# Patient Record
Sex: Male | Born: 1955 | Race: White | Hispanic: No | Marital: Single | Smoking: Never smoker
Health system: Southern US, Community
[De-identification: ages and names within clinical notes are randomized; demographics above are authoritative.]

## PROBLEM LIST (undated history)

## (undated) DIAGNOSIS — K219 Gastro-esophageal reflux disease without esophagitis: Secondary | ICD-10-CM

## (undated) DIAGNOSIS — I10 Essential (primary) hypertension: Secondary | ICD-10-CM

## (undated) HISTORY — DX: Gastro-esophageal reflux disease without esophagitis: K21.9

## (undated) HISTORY — DX: Essential (primary) hypertension: I10

## (undated) HISTORY — PX: OTHER SURGICAL HISTORY: SHX169

---

## 2002-05-23 ENCOUNTER — Encounter: Payer: Self-pay | Admitting: Internal Medicine

## 2010-10-02 ENCOUNTER — Encounter: Payer: Self-pay | Admitting: Internal Medicine

## 2010-10-17 ENCOUNTER — Encounter: Payer: Self-pay | Admitting: Internal Medicine

## 2010-10-17 ENCOUNTER — Ambulatory Visit: Payer: Self-pay | Admitting: Internal Medicine

## 2010-10-17 DIAGNOSIS — I10 Essential (primary) hypertension: Secondary | ICD-10-CM

## 2010-10-17 DIAGNOSIS — R739 Hyperglycemia, unspecified: Secondary | ICD-10-CM

## 2010-10-17 HISTORY — DX: Essential (primary) hypertension: I10

## 2010-10-24 ENCOUNTER — Encounter: Admission: RE | Admit: 2010-10-24 | Discharge: 2010-10-24 | Payer: Self-pay | Admitting: Internal Medicine

## 2010-10-29 ENCOUNTER — Encounter: Payer: Self-pay | Admitting: Internal Medicine

## 2010-10-31 ENCOUNTER — Ambulatory Visit: Payer: Self-pay | Admitting: Internal Medicine

## 2011-01-14 NOTE — Letter (Signed)
   [facility] [address] [place], [place]  [address] Phone: [phone]      October 29, 2010   GAELAN GLENNON [address] [place], [place] [address]  RE:  LAB RESULTS  Dear  Mr. GHEEN,  The following is an interpretation of your most recent lab tests.  Please take note of any instructions provided or changes to medications that have resulted from your lab work. Ultrasound reveals steatic hepatosis = fatty infiltration of the liver. This may be the cause of the elevated GGT.  Sorry I could not reach you by phone. Continue with you plan to reduce alcohol consumption to a limit of 2 4oz glasses of wine per day.  Please come see me if you have any questions about these lab results.   Sincerely Yours,    Jacques Navy MD

## 2011-01-14 NOTE — Assessment & Plan Note (Signed)
Summary: re-establish per pt/Bcbs/ # cd   Vital Signs:  Patient profile:   55 year old male Height:      68 inches Weight:      163 pounds BMI:     24.87 O2 Sat:      98 % on Room air Temp:     98.1 degrees F oral Pulse rate:   64 / minute BP sitting:   186 / 102  (left arm) Cuff size:   regular  Vitals Entered By: Bill Salinas CMA (October 17, 2010 3:06 PM)  O2 Flow:  Room air CC: Pt here to re-est care with primary/ ab   Primary Care Provider:  Jacques Navy MD  CC:  Pt here to re-est care with primary/ ab.  History of Present Illness: Mr. Panebianco presents to reestablish for on-going medical care. His visit is prompted by work related lab work which revealed an isolated elevation in GGT. He was alarmed by this, especially after doing some reading on the internet. As a result he has greatly curtailed his alcohol consumption having assumed that this lab reflected significant liver disease. Labs are reviewed: it is noted that the ALT, AST and alkaline phosphotase are normal. He has had no abdominal pain, no change in bowel habit, no nausea or vomiting and no jaundice. He does admit to drinking 3+ glasses of wine daily.  It is noted that his blood pressure is markedly elevated at today's visit. He has had no HA, no diploplia and no neurologic symptoms. He has no chest pain, decreased exercise tolerance or other cardiovascular symptoms.   Preventive Screening-Counseling & Management  Alcohol-Tobacco     Alcohol drinks/day: 3     Alcohol type: wine     Smoking Status: never  Caffeine-Diet-Exercise     Caffeine use/day: 3 cups daily     Does Patient Exercise: yes     Type of exercise: Walking     Exercise (avg: min/session): 30-60     Times/week: 5  Hep-HIV-STD-Contraception     Dental Visit-last 6 months no  Safety-Violence-Falls     Seat Belt Use: yes     Helmet Use: yes     Firearms in the Home: firearms in the home     Smoke Detectors: yes     Violence in the  Home: no risk noted     Sexual Abuse: no     Fall Risk: Low fall risk  Current Medications (verified): 1)  Pepcid Ac 10 Mg Tabs (Famotidine) .[name].. 1 Daily 2)  Claritin 10 Mg Tabs (Loratadine) .[name].. 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  The patient denies anorexia, fever, vision loss, decreased hearing, chest pain, syncope, dyspnea  on exertion, peripheral edema, prolonged cough, headaches, abdominal pain, hematuria, incontinence, muscle weakness, difficulty walking, depression, abnormal bleeding, enlarged lymph nodes, and angioedema.    Physical Exam  General:  WNWD white male in no distress Head:  Normocephalic and atraumatic without obvious abnormalities. No apparent alopecia or balding. Eyes:  vision grossly intact, pupils equal, pupils round, corneas and lenses clear, no optic disk abnormalities, and no retinal abnormalitiies.   Ears:  R ear normal and L ear normal.   Mouth:  Oral mucosa and oropharynx without lesions or exudates.  Teeth in good repair. Neck:  supple, full ROM, no thyromegaly, and no carotid bruits.   Chest Wall:  no deformities.   Lungs:  Normal respiratory effort, chest expands  symmetrically. Lungs are clear to auscultation, no crackles or wheezes. Heart:  Normal rate and regular rhythm. S1 and S2 normal without gallop, murmur, click, rub or other extra sounds. Abdomen:  soft, non-tender, normal bowel sounds, no masses, no guarding, no hepatomegaly, and no splenomegaly.   Msk:  normal ROM and no joint deformities.   Pulses:  2+ radial pulse Extremities:  No clubbing, cyanosis, edema, or deformity noted with normal full range of motion of all joints.   Neurologic:  alert & oriented X3, cranial nerves II-XII intact, strength normal in all extremities, gait normal, and DTRs symmetrical and normal.   Skin:  turgor normal and color normal.  No jaundice. Cervical Nodes:  no anterior cervical adenopathy and no posterior cervical adenopathy.   Psych:  Oriented X3, memory intact for recent and remote, normally interactive, good eye contact, and not anxious appearing.     Impression & Recommendations:  Problem # 1:  OTHER ABNORMAL BLOOD CHEMISTRY (ICD-790.6) outside labs: AST 47, ALT 46, Alk Phos 63, Hep B &  Hep C negative, albumin normal. Exam is normal. There is little evidence of liver disease by exam and labs. Isolated GGT elevation is of questionable significance with pathologic elevations always being associated with elevated alkaline phosphotase and transaminases.  Plan - Abdominal U/S to evaluated liver architecture and and gallbladder.           prudence in alcohol consumption = 2 oz per day (2 x 4 oz glass of alcohol)  Orders: Radiology Referral (Radiology)  Problem # 2:  ELEVATED BLOOD PRESSURE WITHOUT DIAGNOSIS OF HYPERTENSION (ICD-796.2) Patient with significant elevation in BP. He has no co-morbidities. 12 lead EKG is normal.  Plan - initiate diuretic therapy with HCTZ 25 mg once daily           f/u BP in 3 weeks.  His updated medication list for this problem includes:    Hydrochlorothiazide 25 Mg Tabs (Hydrochlorothiazide) .[name]... 1 by mouth once  daily  Problem # 3:  Preventive Health Care (ICD-V70.0) Patient with no compliants and feels generally well. His screening labs were normal except for GGT: LDL 81, HDL 97.7 (extremely positive and protective value), srum glucose of 69. He is a good candidate for routine colorectal cancer screening with colonoscopy which can be scheduled at his convenience. Will need to check on immunizations, i.e. tetnus.  Complete Medication List: 1)  Pepcid Ac 10 Mg Tabs (Famotidine) .[name].. 1 daily 2)  Claritin 10 Mg Tabs (Loratadine) .[name].. 1 tablet daily 3)  Hydrochlorothiazide 25 Mg Tabs (Hydrochlorothiazide) .[name].. 1 by mouth once daily  Other Orders: EKG w/ Interpretation ([address]) Prescriptions: HYDROCHLOROTHIAZIDE 25 MG TABS (HYDROCHLOROTHIAZIDE) 1 by mouth once daily  #30 x 12   Entered and Authorized by:  Jacques Navy MD   Signed by:   Jacques Navy MD on 10/17/2010   Method used:   Electronically to        [facility]* (retail)       [address]       [place], [place]  [id number]       Ph: [phone]       Fax: [phone]   RxID:   [id number]    Orders Added: 1)  EKG w/ Interpretation [[address]] 2)  New Patient Level III [[address]] 3)  Radiology Referral [Radiology]

## 2011-01-14 NOTE — Assessment & Plan Note (Signed)
Summary: 2 wk f/u#/cd   Vital Signs:  Patient profile:   55 year old male Height:      68 inches Weight:      162 pounds BMI:     24.72 O2 Sat:      97 % on Room air Temp:     98.0 degrees F oral Pulse rate:   68 / minute BP sitting:   138 / 84  (left arm) Cuff size:   regular  Vitals Entered By: Bill Salinas CMA (October 31, 2010 2:12 PM)  O2 Flow:  Room air CC: pt here for follow up on BP after starting HCTZ/ ab   Primary Care Provider:  Jacques Navy MD  CC:  pt here for follow up on BP after starting HCTZ/ ab.  History of Present Illness: Patient returns for follow-up of blood pressure. He was started on HCTZ which he is tolerating well. BP is better controlled.  Did review U/S done as part of work up of isolated elevation in GGT. He was revealed to have fatty infiltrate of the liver otherwise a normal study. He reports that he has continued to follow a reduced alcohol intake regimen.   Current Medications (verified): 1)  Pepcid Ac 10 Mg Tabs (Famotidine) .[name].. 1 Daily 2)  Claritin 10 Mg Tabs (Loratadine) .[name].. 1 Tablet Daily 3)  Hydrochlorothiazide 25 Mg Tabs (Hydrochlorothiazide) .[name].. 1 By Mouth Once Daily  Allergies (verified): No Known Drug Allergies PMH-FH-SH reviewed-no changes except otherwise noted  Review of Systems  The patient denies anorexia, weight loss, decreased hearing, chest pain, dyspnea on exertion, prolonged cough, abdominal pain, muscle weakness, depression, and angioedema.    Physical Exam  General:  Well-developed,well-nourished,in no acute distress; alert,appropriate and cooperative throughout examination Eyes:  C&S clear Lungs:  normal respiratory effort and normal breath sounds.   Heart:  normal rate and regular rhythm.   Neurologic:  alert & oriented X3, cranial nerves II-XII intact, and gait normal.   Skin:  turgor normal and color normal.   Psych:  Oriented X3, normally interactive, and good eye contact.     Impression &  Recommendations:  Problem # 1:  OTHER ABNORMAL BLOOD CHEMISTRY (ICD-790.6) Isolated GGT elevation - maybe related to alcohol.  Plan - limit alcohol intake to an average of 2 oz per day.          low fat diet.   Problem # 2:  ELEVATED BLOOD PRESSURE WITHOUT DIAGNOSIS OF HYPERTENSION (ICD-796.2)  His updated medication list for this problem includes:    Hydrochlorothiazide 25 Mg Tabs (Hydrochlorothiazide) .[name]... 1 by mouth once daily  BP today: 138/84 Prior BP: 186/102 (10/17/2010)  Instructed in low sodium diet (DASH Handout) and behavior modification.   Doing well with HCTZ and no side affects.    Complete Medication List: 1)  Pepcid Ac 10 Mg Tabs (Famotidine) .[name].. 1 daily 2)  Claritin 10 Mg Tabs (Loratadine) .[name].. 1 tablet daily 3)  Hydrochlorothiazide 25 Mg Tabs (Hydrochlorothiazide) .[name].. 1 by mouth once daily   Orders Added: 1)  Est. Patient Level III [[address]]

## 2011-01-16 NOTE — Progress Notes (Signed)
Summary: [facility]   Imported By: Sherian Rein 11/28/2010 09:46:28  _____________________________________________________________________  External Attachment:    Type:   Image     Comment:   External Document

## 2011-01-23 ENCOUNTER — Other Ambulatory Visit: Payer: Self-pay

## 2011-01-27 ENCOUNTER — Encounter (INDEPENDENT_AMBULATORY_CARE_PROVIDER_SITE_OTHER): Payer: Self-pay | Admitting: *Deleted

## 2011-01-27 ENCOUNTER — Other Ambulatory Visit: Payer: Self-pay

## 2011-01-27 ENCOUNTER — Other Ambulatory Visit: Payer: Self-pay | Admitting: Internal Medicine

## 2011-01-27 DIAGNOSIS — R4182 Altered mental status, unspecified: Secondary | ICD-10-CM

## 2011-01-27 DIAGNOSIS — Z0389 Encounter for observation for other suspected diseases and conditions ruled out: Secondary | ICD-10-CM

## 2011-01-27 DIAGNOSIS — Z0000 Encounter for general adult medical examination without abnormal findings: Secondary | ICD-10-CM

## 2011-01-27 LAB — TSH: TSH: 2.46 u[IU]/mL (ref 0.35–5.50)

## 2011-01-27 LAB — CBC WITH DIFFERENTIAL/PLATELET
Basophils Absolute: 0 10*3/uL (ref 0.0–0.1)
Basophils Relative: 0.6 % (ref 0.0–3.0)
HCT: 46.5 % (ref 39.0–52.0)
Hemoglobin: 15.9 g/dL (ref 13.0–17.0)
Lymphs Abs: 1.6 10*3/uL (ref 0.7–4.0)
MCHC: 34.2 g/dL (ref 30.0–36.0)
Monocytes Relative: 9.1 % (ref 3.0–12.0)
Neutro Abs: 3.4 10*3/uL (ref 1.4–7.7)
RBC: 4.85 Mil/uL (ref 4.22–5.81)
RDW: 13.1 % (ref 11.5–14.6)

## 2011-01-27 LAB — LIPID PANEL
HDL: 73.5 mg/dL (ref >39.00)
Total CHOL/HDL Ratio: 2

## 2011-01-27 LAB — URINALYSIS
Bilirubin Urine: NEGATIVE
Leukocytes, UA: NEGATIVE
Nitrite: NEGATIVE
Specific Gravity, Urine: 1.01 (ref 1.000–1.030)
Total Protein, Urine: NEGATIVE
pH: 7 (ref 5.0–8.0)

## 2011-01-27 LAB — BASIC METABOLIC PANEL
CO2: 31 mEq/L (ref 19–32)
GFR: 95.81 mL/min (ref >60.00)
Glucose, Bld: 118 mg/dL — ABNORMAL HIGH (ref 70–99)
Potassium: 3.4 mEq/L — ABNORMAL LOW (ref 3.5–5.1)
Sodium: 134 mEq/L — ABNORMAL LOW (ref 135–145)

## 2011-01-27 LAB — PSA: PSA: 1.3 ng/mL (ref 0.10–4.00)

## 2011-01-27 LAB — GAMMA GT: GGT: 106 U/L — ABNORMAL HIGH (ref 7–51)

## 2011-01-27 LAB — HEPATIC FUNCTION PANEL
AST: 32 U/L (ref 0–37)
Albumin: 4 g/dL (ref 3.5–5.2)
Total Protein: 6.8 g/dL (ref 6.0–8.3)

## 2011-01-30 ENCOUNTER — Encounter (INDEPENDENT_AMBULATORY_CARE_PROVIDER_SITE_OTHER): Payer: BC Managed Care – PPO | Admitting: Internal Medicine

## 2011-01-30 ENCOUNTER — Encounter: Payer: Self-pay | Admitting: Internal Medicine

## 2011-01-30 DIAGNOSIS — Z0000 Encounter for general adult medical examination without abnormal findings: Secondary | ICD-10-CM

## 2011-02-03 ENCOUNTER — Telehealth: Payer: Self-pay | Admitting: Internal Medicine

## 2011-02-05 NOTE — Assessment & Plan Note (Signed)
Summary: CPE & Hypertension Maintainenance   Vital Signs:  Patient profile:   55 year old male Height:      68.50 inches Weight:      156.50 pounds BMI:     23.53 O2 Sat:      98 % on Room air Temp:     98.3 degrees F oral Pulse rate:   73 / minute Resp:     18 per minute BP sitting:   146 / 94  (left arm)  Vitals Entered By: Burnard Leigh CMA(AAMA) (January 30, 2011 2:44 PM)  O2 Flow:  Room air CC: Wellness CPE & Blood Pressure Maintenance Is Patient Diabetic? No   Primary Care Provider:  Jacques Navy MD  CC:  Wellness CPE & Blood Pressure Maintenance.  History of Present Illness: Tyrone Davis presents for an annual wellness exam. He is feeling well and has no specific medical complaints. He has curtailed his alcohol intake due to elevated GGT at 208. He is staying active but not really exercsing on a regular basis.   Preventive Screening-Counseling & Management  Alcohol-Tobacco     Alcohol drinks/day: 2     Alcohol type: wine     Smoking Status: never  Caffeine-Diet-Exercise     Caffeine use/day: 2 cups per day     Diet Comments: No Regular     Does Patient Exercise: yes     Type of exercise: walking 4 miles     Exercise (avg: min/session): >60     Times/week: 7  Hep-HIV-STD-Contraception     Dental Visit-last 6 months yes  Safety-Violence-Falls     Seat Belt Use: yes     Firearms in the Home: firearms in the home     Violence in the Home: not applicable     Fall Risk: No  Current Medications (verified): 1)  Pepcid Ac 10 Mg Tabs (Famotidine) .[name].. 1 Daily 2)  Claritin 10 Mg Tabs (Loratadine) .[name].. 1 Tablet Daily 3)  Hydrochlorothiazide 25 Mg Tabs (Hydrochlorothiazide) .[name].. 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    Physical Exam  General:  alert, well-developed, well-nourished, well-hydrated, and normal appearance.   Head:  Normocephalic and atraumatic without obvious abnormalities. No apparent alopecia or balding. Eyes:  vision grossly intact, pupils equal, pupils round, pupils reactive to light, corneas and lenses clear, no injection, and no optic disk abnormalities.   Ears:  External ear exam shows no significant lesions or deformities.  Otoscopic examination reveals clear canals, tympanic membranes are intact  bilaterally without bulging, retraction, inflammation or discharge. Hearing is grossly normal bilaterally. Nose:  no external deformity, no external erythema, and no nasal discharge.   Mouth:  Oral mucosa and oropharynx without lesions or exudates.  Teeth in good repair. Neck:  supple, full ROM, no masses, no thyromegaly, and no carotid bruits.   Chest Wall:  No deformities, masses, tenderness or gynecomastia noted. Lungs:  normal respiratory effort, normal breath sounds, no crackles, and no wheezes.   Heart:  normal rate,  regular rhythm, no murmur, no gallop, and no JVD.   Abdomen:  soft, non-tender, normal bowel sounds, no guarding, no rigidity, and no hepatomegaly.   Prostate:  deferred to normal PSA Msk:  normal ROM, no joint tenderness, no joint swelling, no joint warmth, and no joint instability.   Pulses:  2+ radial pulse Extremities:  No clubbing, cyanosis, edema, or deformity noted with normal full range of motion of all joints.   Neurologic:  alert & oriented X3, cranial nerves II-XII intact, strength normal in all extremities, sensation intact to light touch, gait normal, and DTRs symmetrical and normal.   Skin:  turgor normal, color normal, no suspicious lesions, no petechiae, and no ulcerations.   Cervical Nodes:  no anterior cervical adenopathy and no posterior cervical adenopathy.   Psych:  Oriented X3, memory intact for recent and remote, normally interactive, and good eye contact.     Impression & Recommendations:  Problem # 1:  OTHER ABNORMAL BLOOD CHEMISTRY (ICD-790.6) Repeat GGT is down to 100 range.   Plan - continue to be careful about alcohol consumption.          Can get a repeat lab in  3  months  Problem # 2:  ELEVATED BLOOD PRESSURE WITHOUT DIAGNOSIS OF HYPERTENSION (ICD-796.2)  His updated medication list for this problem includes:    Hydrochlorothiazide 25 Mg Tabs (Hydrochlorothiazide) .[name]... 1 by mouth once daily  BP today: 146/94 Prior BP: 138/84 (10/31/2010)  Instructed in low sodium diet (DASH Handout) and behavior modification.    Suboptimal control  Plan - continue HCTZ           monitor BP at home. IF SBP consistently 140 or higher will need to adjust medication  Problem # 3:  Preventive Health Care (ICD-V70.0) Interval history is unremarkable. Physical exam is normal. Lab resuslts reveal an excellent lipid panel. Potassium is just a little low and will stgart potassium replacement. Glucose was elevated at 119, previous lab glucose was 69. He will need to be  prudent about sugar and simple carbohydrates. Will check an A1C in 3 months. No colonoscopy on record and he is a candidate for routine colorectal cancer screening which we can schedule once he agrees to have the study. Immunizations - had flu shot. Had a normal EKG in NOvember '11. Has a normal PSA.  In summary - a nice man who has mildly elevated serum glucose and mildly persistent elevation in GGT. Plans as above: return for lab in 3 months.   Complete Medication List: 1)  Pepcid Ac 10 Mg Tabs (Famotidine) .[name].. 1 daily 2)  Claritin 10 Mg Tabs (Loratadine) .[name].. 1 tablet daily 3)  Hydrochlorothiazide 25 Mg Tabs (Hydrochlorothiazide) .[name].. 1 by mouth once daily  Patient: Tyrone Davis Note: All result statuses are Final unless otherwise noted.  Tests: (1) BMP (METABOL)   Sodium               [L]  134 mEq/L  135-145   Potassium            [L]  3.4 mEq/L                   3.5-5.1   Chloride             [L]  94 mEq/L                    96-112   Carbon Dioxide            31 mEq/L                    19-32   Glucose              [H]  118 mg/dL                   16-10   BUN                       19 mg/dL                    9-60   Creatinine                0.9 mg/dL                   4.5-4.0   Calcium                   8.9 mg/dL                   9.8-11.9   GFR                       95.81 mL/min                >60.00  Tests: (2) CBC Platelet w/Diff (CBCD)   White Cell Count          5.7 K/uL                    4.5-10.5   Red Cell Count            4.85 Mil/uL                 4.22-5.81   Hemoglobin                15.9 g/dL                   14.7-82.9   Hematocrit                46.5 %                      39.0-52.0   MCV                       95.9 fl                     78.0-100.0   MCHC                      34.2 g/dL                   56.2-13.0   RDW  13.1 %                      11.5-14.6   Platelet Count            214.0 K/uL                   150.0-400.0   Neutrophil %              58.9 %                      43.0-77.0   Lymphocyte %              28.0 %                      12.0-46.0   Monocyte %                9.1 %                       3.0-12.0   Eosinophils%              3.4 %                       0.0-5.0   Basophils %               0.6 %                       0.0-3.0   Neutrophill Absolute      3.4 K/uL                    1.4-7.7   Lymphocyte Absolute       1.6 K/uL                    0.7-4.0   Monocyte Absolute         0.5 K/uL                    0.1-1.0  Eosinophils, Absolute                             0.2 K/uL                    0.0-0.7   Basophils Absolute        0.0 K/uL                    0.0-0.1  Tests: (3) Hepatic/Liver Function Panel (HEPATIC)   Total Bilirubin      [H]  1.4 mg/dL                   0.4-5.4   Direct Bilirubin          0.2 mg/dL                   0.9-8.1   Alkaline Phosphatase      51 U/L                      39-117   AST                       32 U/L  0-37   ALT                       30 U/L                      0-53   Total Protein             6.8 g/dL                    1.6-1.0   Albumin                   4.0 g/dL                    9.6-0.4  Tests: (4) TSH (TSH)   FastTSH                   2.46 uIU/mL                 0.35-5.50  Tests: (5) Lipid Panel (LIPID)   Cholesterol               158 mg/dL                   5-409     ATP III Classification            Desirable:  < 200 mg/dL                    Borderline High:  200 - 239 mg/dL               High:  > = 240 mg/dL   Triglycerides             35.0 mg/dL                  8.1-191.4     Normal:  <150 mg/dL     Borderline High:  782 - 199 mg/dL   HDL                       95.62 mg/dL                 >13.08   VLDL Cholesterol          7.0 mg/dL                   6.5-78.4   LDL Cholesterol           78 mg/dL                    6-96  CHO/HDL Ratio:  CHD Risk                             2                    Men           Women     1/2 Average Risk     3.4          3.3     Average Risk          5.0          4.4     2X Average Risk          9.6          7.1  3X Average Risk          15.0          11.0                           Tests: (6) UDip Only (UDIP)   Color                     LT. YELLOW       RANGE:  Yellow;Lt. Yellow   Clarity                   CLEAR                       Clear   Specific Gravity          1.010                       1.000 - 1.030   Urine Ph                  7.0                         5.0-8.0   Protein                   NEGATIVE                    Negative   Urine Glucose             NEGATIVE                    Negative   Ketones                   NEGATIVE                    Negative   Urine Bilirubin           NEGATIVE                    Negative   Blood                     NEGATIVE                    Negative   Urobilinogen              0.2                         0.0 - 1.0   Leukocyte Esterace        NEGATIVE                    Negative   Nitrite                   NEGATIVE                    Negative  Tests: (7) Prostate Specific Antigen (PSA)   PSA-Hyb                   1.30 ng/mL                  0.10-4.00  Tests: (8) GGT (GGT)   GGT                  [  H]  106 U/L                     7-51  Orders Added: 1)  Est. Patient 40-64 years [[address]]   Immunization History:  Influenza Immunization History:    Influenza:  historical (09/14/2010)   Immunization History:  Influenza Immunization History:    Influenza:  Historical (09/14/2010)      Laboratory Results

## 2011-02-11 NOTE — Progress Notes (Signed)
Summary: New Rx name and directions request/sls,cma  Phone Note From Pharmacy   Caller: [facility] Summary of Call: Pharmacy states they arte expecting a new Rx for Potassium Chlor.They need the new medication name and directions,,Callback to pharmacy is [phone]..sls,cma Initial call taken by: Burnard Leigh [facility]),  February 03, 2011 8:18 AM  Follow-up for Phone Call        K. K+ 10 me1, #30, 1 by mouth once daily as needed rfills Follow-up by: Jacques Navy MD,  February 04, 2011 1:52 PM    New/Updated Medications: KLOR-CON 10 10 MEQ CR-TABS (POTASSIUM CHLORIDE) 1 by mouth once daily Prescriptions: KLOR-CON 10 10 MEQ CR-TABS (POTASSIUM CHLORIDE) 1 by mouth once daily  #90 x 3   Entered by:   Vertis Kelch)   Authorized by:   Jacques Navy MD   Signed by:   Burnard Leigh [facility]) on 02/04/2011   Method used:   Electronically to        [facility]* (retail)       [address]       [place], [place]  [id number]       Ph: [phone]       Fax: [phone]   RxID:   [id number]

## 2011-04-29 ENCOUNTER — Other Ambulatory Visit: Payer: Self-pay | Admitting: Internal Medicine

## 2011-04-29 ENCOUNTER — Other Ambulatory Visit (INDEPENDENT_AMBULATORY_CARE_PROVIDER_SITE_OTHER): Payer: BC Managed Care – PPO

## 2011-04-29 DIAGNOSIS — I10 Essential (primary) hypertension: Secondary | ICD-10-CM

## 2011-04-29 DIAGNOSIS — R7989 Other specified abnormal findings of blood chemistry: Secondary | ICD-10-CM

## 2011-04-29 LAB — BASIC METABOLIC PANEL
Calcium: 9.2 mg/dL (ref 8.4–10.5)
Creatinine, Ser: 1 mg/dL (ref 0.4–1.5)
GFR: 80.73 mL/min (ref >60.00)
Sodium: 141 mEq/L (ref 135–145)

## 2011-04-29 LAB — GAMMA GT: GGT: 117 U/L — ABNORMAL HIGH (ref 7–51)

## 2011-04-29 LAB — HEMOGLOBIN A1C: Hgb A1c MFr Bld: 6.1 % (ref 4.6–6.5)

## 2011-05-05 ENCOUNTER — Encounter: Payer: Self-pay | Admitting: Internal Medicine

## 2011-10-24 ENCOUNTER — Other Ambulatory Visit: Payer: Self-pay | Admitting: *Deleted

## 2011-10-30 ENCOUNTER — Other Ambulatory Visit: Payer: Self-pay | Admitting: *Deleted

## 2011-10-30 MED ORDER — HYDROCHLOROTHIAZIDE 25 MG PO TABS: 25.0000 mg | ORAL_TABLET | Freq: Every day | ORAL | Status: DC

## 2011-12-18 ENCOUNTER — Encounter: Payer: Self-pay | Admitting: Internal Medicine

## 2012-01-01 ENCOUNTER — Ambulatory Visit (INDEPENDENT_AMBULATORY_CARE_PROVIDER_SITE_OTHER): Payer: BC Managed Care – PPO | Admitting: Internal Medicine

## 2012-01-01 VITALS — BP 160/96 | HR 71 | Temp 98.4°F | Wt 163.0 lb

## 2012-01-01 DIAGNOSIS — R030 Elevated blood-pressure reading, without diagnosis of hypertension: Secondary | ICD-10-CM

## 2012-01-01 MED ORDER — LISINOPRIL-HYDROCHLOROTHIAZIDE 10-12.5 MG PO TABS: 1.0000 | ORAL_TABLET | Freq: Every day | ORAL | Status: DC

## 2012-01-01 NOTE — Patient Instructions (Signed)
BP Readings from Last 3 Encounters:  01/01/12 160/96  01/30/11 146/94  10/31/10 138/84   No adequately controlled. Plan - will change to an ACE-I plus HCTZ =lisinopril/Hct 10/12.5 taken once a day. Generally a very well tolerated product. Plan - start new drug           Return in 3-4 weeks for lab work to check on kidney function            Blood pressure should consistently be under 140/90-keep record and report back.            Call for problems

## 2012-01-04 NOTE — Assessment & Plan Note (Signed)
BP Readings from Last 3 Encounters:  01/01/12 160/96  01/30/11 146/94  10/31/10 138/84   Suboptimal control  Plan - add lisinopril 10 mg, continuing HCTZ - 10/12.5           F/u lab in 1 month           Monitor BP and report back (OV note needed).

## 2012-01-04 NOTE — Progress Notes (Signed)
  Subjective:    Patient ID: Tyrone Davis, male    DOB: Aug 14, 1956, 56 y.o.   MRN: [id number]  HPI Mr. Walkins presents for follow-up of HTN. He was recently started on HCTZ as single agent. His BP readings have been running too high.   I have reviewed the patient's medical history in detail and updated the computerized patient record.    Review of Systems System review is negative for any constitutional, cardiac, pulmonary, GI or neuro symptoms or complaints other than as described in the HPI.     Objective:   Physical Exam Filed Vitals:   01/01/12 1458  BP: 160/96  Pulse: 71  Temp: 98.4 F (36.9 C)   Gen'l - WNWD heavyset white man in no distress C&S clear Pulom - normal respirations Cor - RRR Neuro A&O x 3        Assessment & Plan:

## 2012-01-08 IMAGING — US US ABDOMEN COMPLETE
1 series · 14 of 25 positions shown · non-contrast
Comparison: None.

CLINICAL DATA: Abnormal liver function tests

COMPLETE ABDOMINAL ULTRASOUND

[Series 1: us abdomen complete · 0.39mm/px · 14 of 70 slices shown]
[im 1/70]
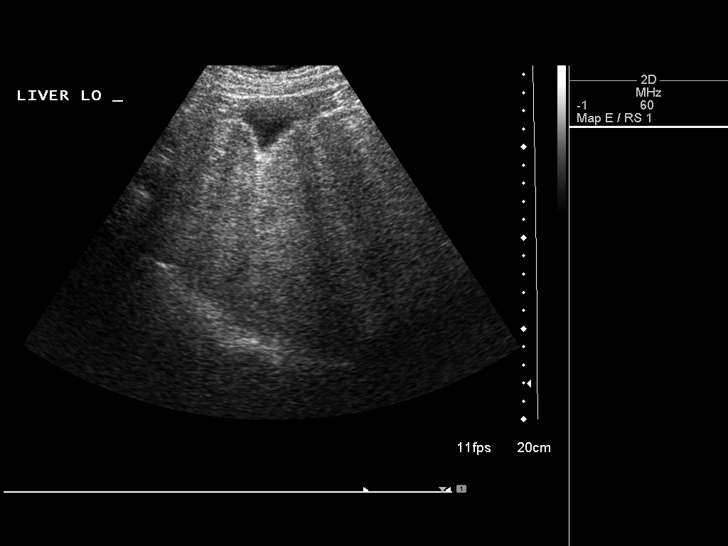
[im 6/70]
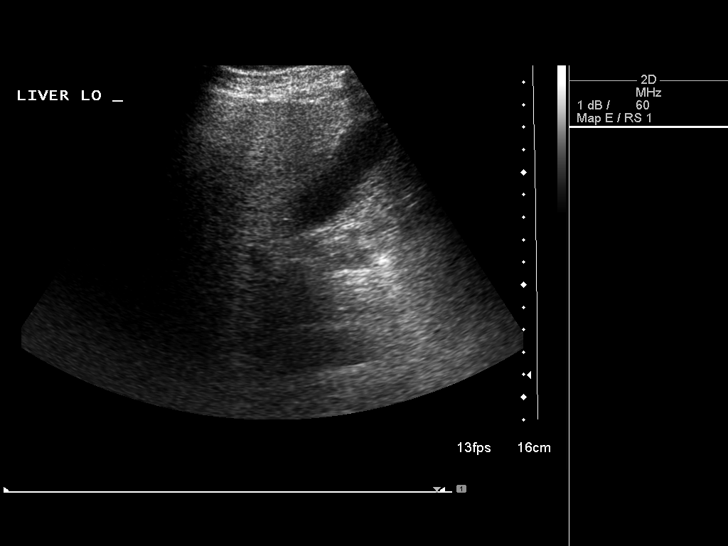
[im 12/70]
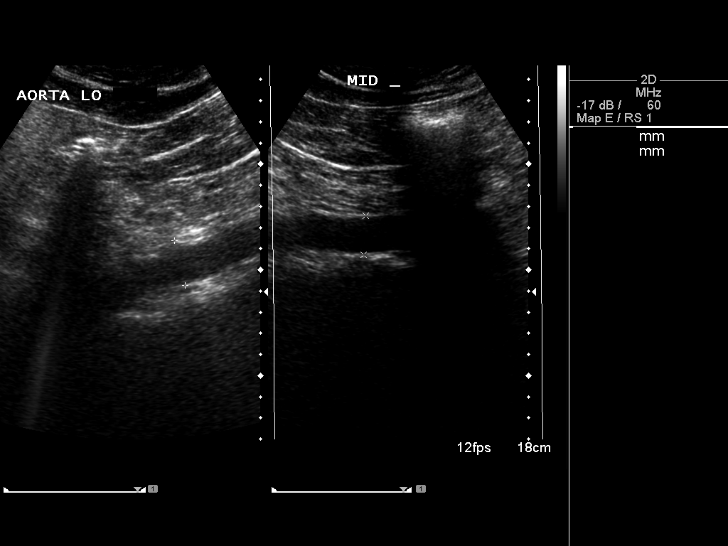
[im 18/70]
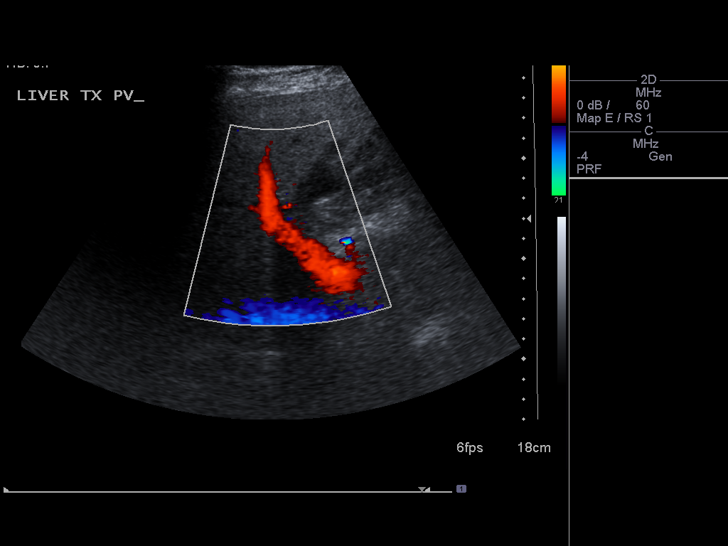
[im 24/70]
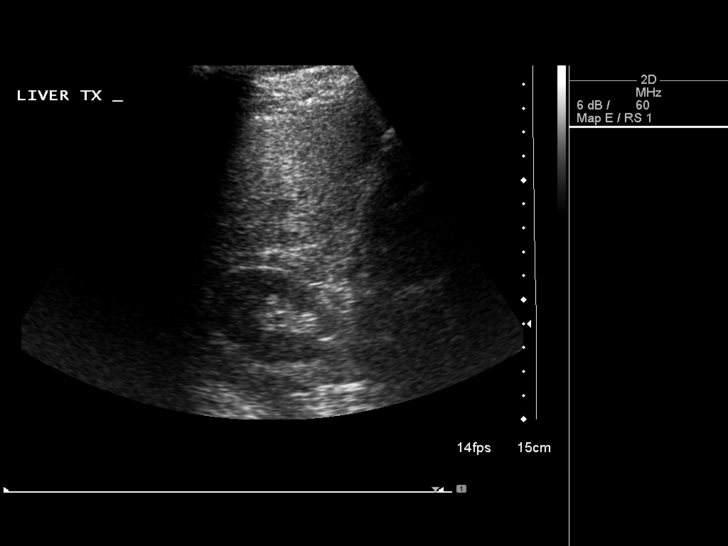
[im 26/70]
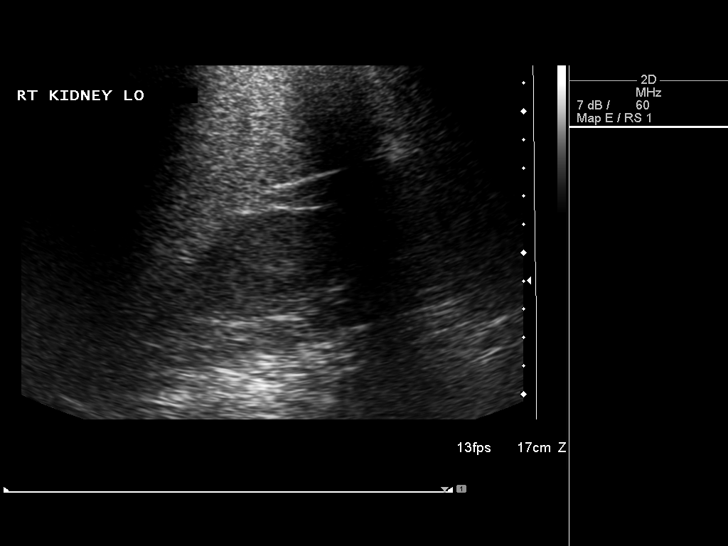
[im 32/70]
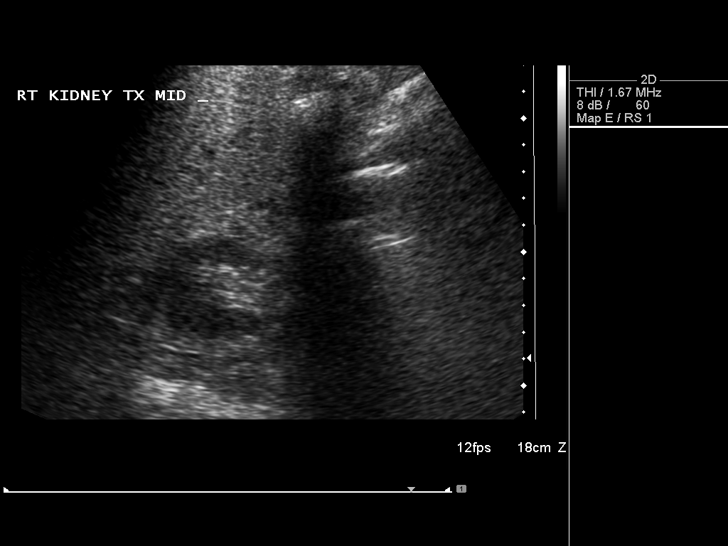
[im 38/70]
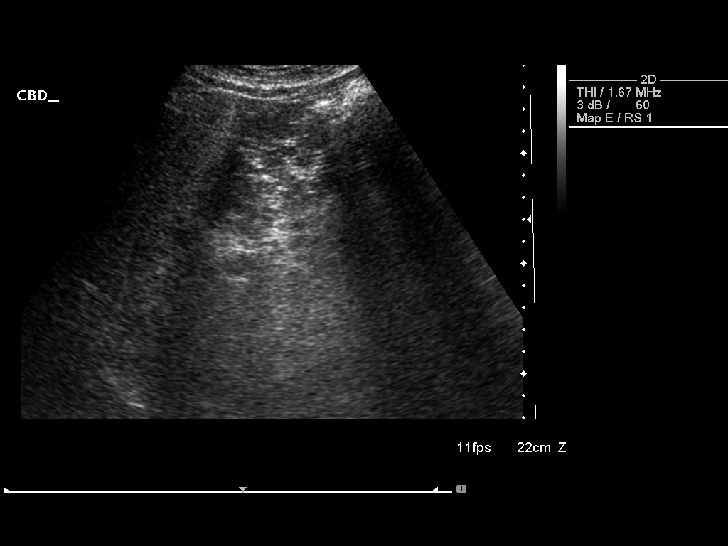
[im 44/70]
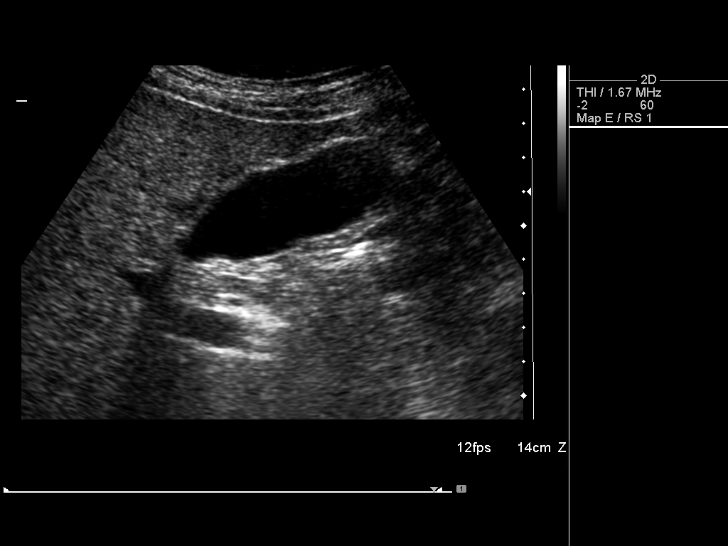
[im 47/70]
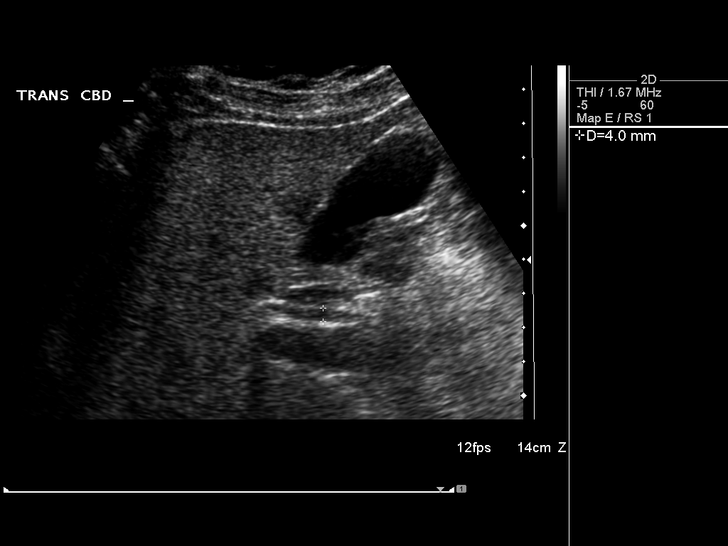
[im 52/70]
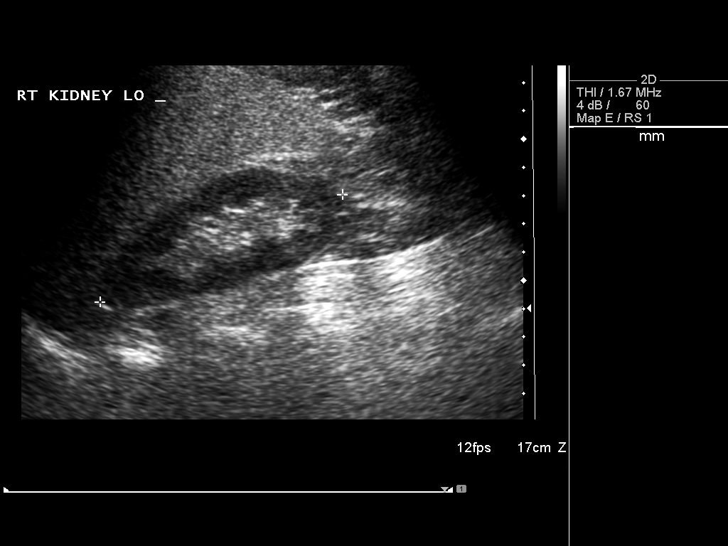
[im 58/70]
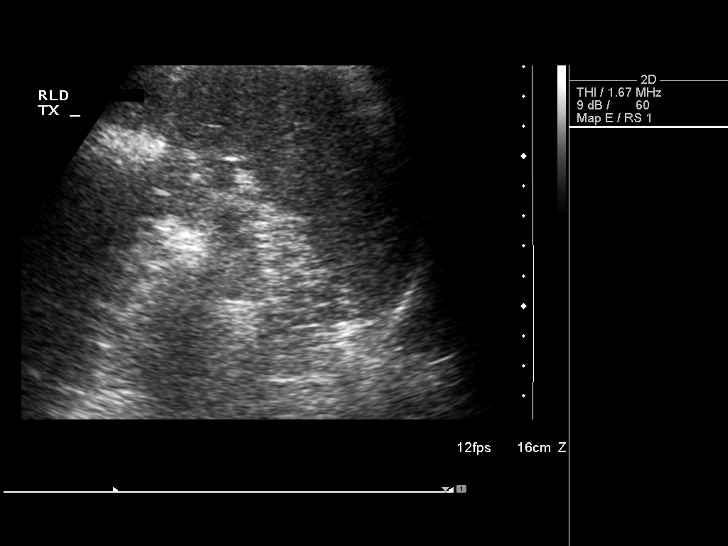
[im 64/70]
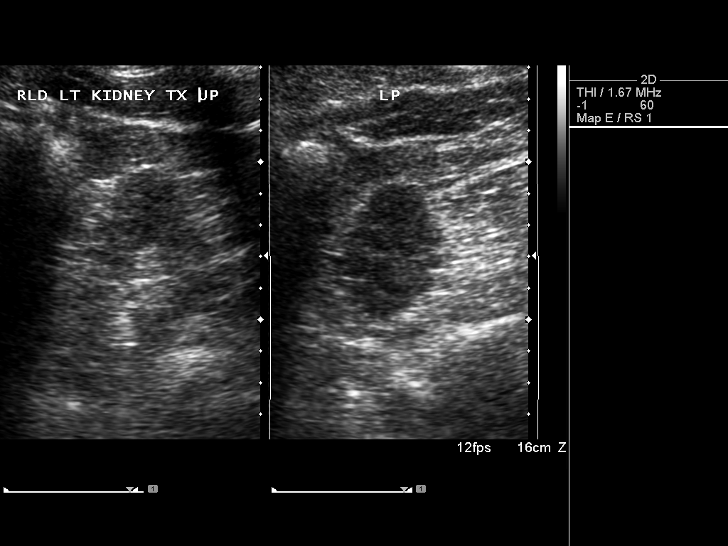
[im 70/70]
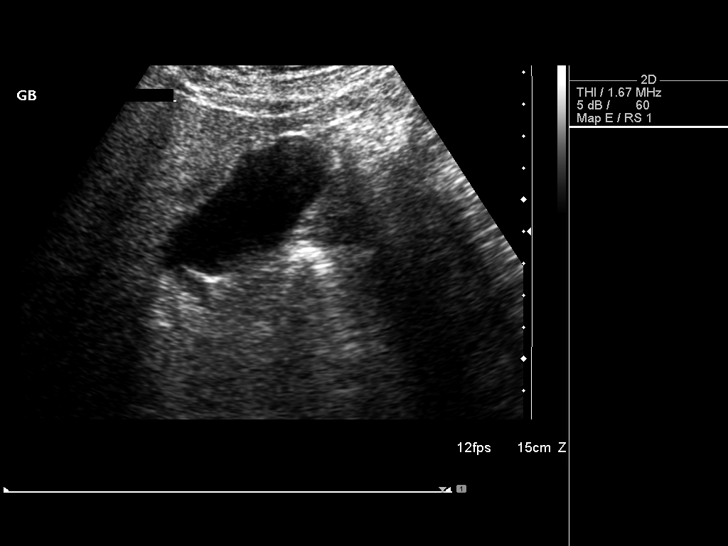

[14 of 25 positions shown; findings below may reference images not displayed]

FINDINGS: Gallbladder:  The gallbladder is visualized and no gallstones are
noted.

Common bile duct:  The common bile duct is not optimally visualized
measuring approximately 4.0 mm in maximum diameter.

Liver:  The liver is diffusely echogenic consistent with hepatic
steatosis with areas of sparing.  No ductal dilatation is seen.

IVC:  Appears normal.

Pancreas:  Portions of the pancreas are obscured by bowel gas.

Spleen:  The spleen is normal measuring 6.3 cm sagittally.

Right Kidney:  No hydronephrosis is seen.  The right kidney
measures 9.5 cm sagittally.

Left Kidney:  No hydronephrosis.  The left kidney measures 11.1 cm.

Abdominal aorta:  The abdominal aorta is normal in caliber.
IMPRESSION: 1.  Echogenic liver consistent with hepatic steatosis with areas of
sparing.  No ductal dilatation.
2.  No gallstones.
3.  Portions of the pancreas are obscured by bowel gas.

## 2012-01-30 ENCOUNTER — Other Ambulatory Visit (INDEPENDENT_AMBULATORY_CARE_PROVIDER_SITE_OTHER): Payer: BC Managed Care – PPO

## 2012-01-30 DIAGNOSIS — R030 Elevated blood-pressure reading, without diagnosis of hypertension: Secondary | ICD-10-CM

## 2012-01-30 LAB — COMPREHENSIVE METABOLIC PANEL
AST: 42 U/L — ABNORMAL HIGH (ref 0–37)
Albumin: 4.4 g/dL (ref 3.5–5.2)
Alkaline Phosphatase: 52 U/L (ref 39–117)
Glucose, Bld: 113 mg/dL — ABNORMAL HIGH (ref 70–99)
Potassium: 4.5 mEq/L (ref 3.5–5.1)
Sodium: 140 mEq/L (ref 135–145)
Total Bilirubin: 1.1 mg/dL (ref 0.3–1.2)
Total Protein: 7.6 g/dL (ref 6.0–8.3)

## 2012-02-01 ENCOUNTER — Encounter: Payer: Self-pay | Admitting: Internal Medicine

## 2012-02-09 ENCOUNTER — Encounter: Payer: Self-pay | Admitting: Internal Medicine

## 2012-02-09 ENCOUNTER — Telehealth: Payer: Self-pay | Admitting: *Deleted

## 2012-02-09 NOTE — Telephone Encounter (Signed)
Attempted to inform patient, per MEN, BP is OK; home phone not set up, work phone not in order, [facility] [[name] Vadala] phone; no answer/no ans machine.

## 2012-03-01 NOTE — Telephone Encounter (Signed)
Letter out

## 2012-07-07 ENCOUNTER — Other Ambulatory Visit: Payer: Self-pay

## 2012-07-07 DIAGNOSIS — R030 Elevated blood-pressure reading, without diagnosis of hypertension: Secondary | ICD-10-CM

## 2012-07-07 MED ORDER — LISINOPRIL-HYDROCHLOROTHIAZIDE 10-12.5 MG PO TABS: 1.0000 | ORAL_TABLET | Freq: Every day | ORAL | Status: DC

## 2013-01-20 ENCOUNTER — Other Ambulatory Visit: Payer: Self-pay | Admitting: Internal Medicine

## 2013-03-31 ENCOUNTER — Encounter: Payer: BC Managed Care – PPO | Admitting: Internal Medicine

## 2013-04-23 ENCOUNTER — Other Ambulatory Visit: Payer: Self-pay | Admitting: Internal Medicine

## 2013-06-06 ENCOUNTER — Encounter: Payer: BC Managed Care – PPO | Admitting: Internal Medicine

## 2013-06-22 ENCOUNTER — Other Ambulatory Visit (INDEPENDENT_AMBULATORY_CARE_PROVIDER_SITE_OTHER): Payer: BC Managed Care – PPO

## 2013-06-22 ENCOUNTER — Ambulatory Visit (INDEPENDENT_AMBULATORY_CARE_PROVIDER_SITE_OTHER): Payer: BC Managed Care – PPO | Admitting: Internal Medicine

## 2013-06-22 ENCOUNTER — Encounter: Payer: Self-pay | Admitting: Internal Medicine

## 2013-06-22 VITALS — BP 158/92 | HR 88 | Temp 97.4°F | Ht 70.0 in | Wt 157.0 lb

## 2013-06-22 DIAGNOSIS — R7989 Other specified abnormal findings of blood chemistry: Secondary | ICD-10-CM

## 2013-06-22 DIAGNOSIS — I10 Essential (primary) hypertension: Secondary | ICD-10-CM

## 2013-06-22 DIAGNOSIS — Z1211 Encounter for screening for malignant neoplasm of colon: Secondary | ICD-10-CM

## 2013-06-22 DIAGNOSIS — Z23 Encounter for immunization: Secondary | ICD-10-CM

## 2013-06-22 DIAGNOSIS — Z0000 Encounter for general adult medical examination without abnormal findings: Secondary | ICD-10-CM

## 2013-06-22 LAB — COMPREHENSIVE METABOLIC PANEL
AST: 49 U/L — ABNORMAL HIGH (ref 0–37)
Albumin: 4.4 g/dL (ref 3.5–5.2)
Alkaline Phosphatase: 52 U/L (ref 39–117)
CO2: 31 mEq/L (ref 19–32)
Chloride: 101 mEq/L (ref 96–112)
Glucose, Bld: 120 mg/dL — ABNORMAL HIGH (ref 70–99)

## 2013-06-22 LAB — HEMOGLOBIN A1C: Hgb A1c MFr Bld: 6.1 % (ref 4.6–6.5)

## 2013-06-22 MED ORDER — LISINOPRIL-HYDROCHLOROTHIAZIDE 10-12.5 MG PO TABS: 1.0000 | ORAL_TABLET | Freq: Every day | ORAL | Status: DC

## 2013-06-22 MED ORDER — LISINOPRIL-HYDROCHLOROTHIAZIDE 20-25 MG PO TABS: 1.0000 | ORAL_TABLET | Freq: Every day | ORAL | Status: DC

## 2013-06-22 NOTE — Assessment & Plan Note (Signed)
BP Readings from Last 3 Encounters:  06/22/13 158/92  01/01/12 160/96  01/30/11 146/94   Not well controlled on present regimen with a goal of 130's/80's  Plan Increase present medication: move to lisinopril/Hct 20/25 - new Rx sent to pharmacy  Follow up BP check.

## 2013-06-22 NOTE — Assessment & Plan Note (Signed)
Serum glucose with mild elevation. A1C - gold standard for diagnosing diabetes is 0.1% above normal.  Plan Prudent low sugar, low carb diet  Regular exercise.

## 2013-06-22 NOTE — Progress Notes (Signed)
Subjective:    Patient ID: Tyrone Davis, male    DOB: 12/06/1956, 57 y.o.   MRN: [id number]  HPI Presents for a general medical exam and follow up for  Hypertension. In the interval he has been well with no major illness, no injury and no surgery.  He has not seen the dentist, he has not been to the eye doctor.   He is getting exercise. Diet is trying to follow a low carb diet.   Past Medical History  Diagnosis Date  . ELEVATED BLOOD PRESSURE WITHOUT DIAGNOSIS OF HYPERTENSION 10/17/2010  . Other abnormal blood chemistry 10/17/2010   Past Surgical History  Procedure Laterality Date  . Trauma- stress fractures while in high school and running track     Family History  Problem Relation Age of Onset  . Stroke Mother   . Thyroid disease Mother   . Cancer Father   . Coronary artery disease Father   . Hypertension Father   . Hyperlipidemia Father   . Diabetes Brother    History   Social History  . Marital Status: Single    Spouse Name: N/A    Number of Children: N/A  . Years of Education: N/A   Occupational History  . Not on file.   Social History Main Topics  . Smoking status: Never Smoker   . Smokeless tobacco: Not on file  . Alcohol Use: Yes  . Drug Use: No  . Sexually Active: Not on file   Other Topics Concern  . 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 famotidine (PEPCID AC) 10 MG chewable tablet Chew 10 mg by mouth 2 (two) times daily.        [name] loratadine (CLARITIN) 10 MG tablet Take 10 mg by mouth daily.        . potassium chloride SA (K-DUR,KLOR-CON) 20 MEQ tablet Take 20 mEq by mouth daily.         No current facility-administered medications on file prior to visit.      Review of  Systems Constitutional:  Negative for fever, chills, activity change and unexpected weight change.  HEENT:  Negative for hearing loss, ear pain, congestion, neck stiffness and postnasal drip. Negative for sore throat or swallowing problems. Negative for dental complaints.   Eyes: Negative for vision loss or change in visual acuity.  Respiratory: Negative for chest tightness and wheezing. Negative for DOE.   Cardiovascular: Negative for chest pain or palpitations. No decreased exercise tolerance Gastrointestinal: No change in bowel habit. No bloating or gas. No reflux or indigestion Genitourinary: Negative for urgency, frequency, flank pain and difficulty urinating.  Musculoskeletal: Negative for myalgias, back pain, arthralgias and gait problem.  Neurological: Negative for dizziness, tremors, weakness and headaches.  Hematological: Negative for adenopathy.  Psychiatric/Behavioral: Negative for behavioral problems and dysphoric mood.       Objective:   Physical Exam Filed Vitals:   06/22/13 1004  BP: 158/92  Pulse: 88  Temp: 97.4 F (36.3 C)   Wt Readings from Last 3 Encounters:  06/22/13 157 lb (71.215 kg)  01/01/12 163 lb (73.936 kg)  01/30/11 156 lb 8 oz (70.988 kg)   Gen'l: Well nourished well developed  white male in no acute distress  HEENT: Head: Normocephalic and atraumatic. Right Ear: External ear normal. EAC/TM nl. Left Ear: External ear normal.  EAC/TM nl. Nose: Nose normal. Mouth/Throat: Oropharynx is clear and moist. Dentition - native, in good repair. No buccal or palatal lesions. Posterior pharynx clear. Eyes: Conjunctivae and sclera clear. EOM intact. Pupils are equal, round, and reactive to light. Right eye exhibits no discharge. Left eye exhibits no discharge. Neck: Normal range of motion. Neck supple. No JVD present. No tracheal deviation present. No thyromegaly present.  Cardiovascular: Normal rate, regular rhythm, no gallop, no friction rub, no murmur heard.       Quiet precordium. 2+ radial and DP pulses . No carotid bruits Pulmonary/Chest: Effort normal. No respiratory distress or increased WOB, no wheezes, no rales. No chest wall deformity or CVAT. Abdomen: Soft. Bowel sounds are normal in all quadrants. He exhibits no distension, no tenderness, no rebound or guarding, No heptosplenomegaly  Genitourinary:  deferred Musculoskeletal: Normal range of motion. He exhibits no edema and no tenderness.       Small and large joints without redness, synovial thickening or deformity. Full range of motion preserved about all small, median and large joints.  Lymphadenopathy:    He has no cervical or supraclavicular adenopathy.  Neurological: He is alert and oriented to person, place, and time. CN II-XII intact. DTRs 2+ and symmetrical biceps, radial and patellar tendons. Cerebellar function normal with no tremor, rigidity, normal gait and station.  Skin: Skin is warm and dry. No rash noted. No erythema. advanced solar changes Head,neck, back, chest, abdomen. No suspicious lesions Psychiatric: He has a normal mood and affect. His behavior is normal. Thought content normal.   Recent Results (from the past 2160 hour(s))  HEMOGLOBIN A1C     Status: None   Collection Time    06/22/13 11:07 AM      Result Value Range   Hemoglobin A1C 6.1  4.6 - 6.5 %   Comment: Glycemic Control Guidelines for People with Diabetes:Non Diabetic:  <6%Goal of Therapy: <7%Additional Action Suggested:  >8%   COMPREHENSIVE METABOLIC PANEL     Status: Abnormal   Collection Time    06/22/13 11:07 AM      Result Value Range   Sodium 138  135 - 145 mEq/L   Potassium 4.1  3.5 - 5.1 mEq/L   Chloride 101  96 - 112 mEq/L   CO2 31  19 - 32 mEq/L   Glucose, Bld 120 (*) 70 - 99 mg/dL   BUN 19  6 - 23 mg/dL   Creatinine, Ser 1.0  0.4 - 1.5 mg/dL   Total Bilirubin 1.4 (*) 0.3 - 1.2 mg/dL   Alkaline Phosphatase 52  39 - 117 U/L   AST 49 (*) 0 - 37 U/L   ALT 36  0 - 53 U/L   Total Protein 7.5   6.0 - 8.3 g/dL   Albumin 4.4  3.5 - 5.2 g/dL   Calcium 16.1  8.4 - 09.6 mg/dL   GFR 04.54  >09.81 mL/min          Assessment & Plan:

## 2013-06-22 NOTE — Assessment & Plan Note (Signed)
Interval history w/o major illness, surgery or injury. Physical exam is normal except for skin damage. Lab results reviewed - in normal range except for minor elevation in serum glucose. Patient does agree to have colonoscopy. Discussed pros and cons of prostate cancer screening (USPHCTF recommendations reviewed and ACU April '13 recommendations) and he defers evaluation at this time - last PSA  '12 was normal. Immunization - Tdap today.  In summary - a healthy appearing man with no active complaints. He will return for general exam in 1 year.

## 2013-06-22 NOTE — Patient Instructions (Addendum)
Thanks for coming in.  Colonoscopy is a GOOD thing to do. We will move ahead with scheduling.  Routine labs today - results will be available on MyChart  Call for questions.

## 2013-08-05 ENCOUNTER — Encounter: Payer: Self-pay | Admitting: Internal Medicine

## 2013-10-20 ENCOUNTER — Other Ambulatory Visit: Payer: Self-pay

## 2014-07-14 ENCOUNTER — Telehealth: Payer: Self-pay | Admitting: Internal Medicine

## 2014-07-14 NOTE — Telephone Encounter (Signed)
[facility] [place] - [address] Is requesting a re-fill on lisinopril-hydrochlorothiazide (PRINZIDE,ZESTORETIC) 20-25 MG per tablet. Pt is scheduled to est with Dr. Alto DenverHunt in October.

## 2014-07-17 MED ORDER — LISINOPRIL-HYDROCHLOROTHIAZIDE 20-25 MG PO TABS: 1.0000 | ORAL_TABLET | Freq: Every day | ORAL | Status: DC

## 2014-10-17 ENCOUNTER — Encounter: Payer: Self-pay | Admitting: Family Medicine

## 2014-10-17 ENCOUNTER — Ambulatory Visit (INDEPENDENT_AMBULATORY_CARE_PROVIDER_SITE_OTHER): Payer: BC Managed Care – PPO | Admitting: Family Medicine

## 2014-10-17 VITALS — BP 160/90 | HR 64 | Wt 157.0 lb

## 2014-10-17 DIAGNOSIS — I10 Essential (primary) hypertension: Secondary | ICD-10-CM

## 2014-10-17 DIAGNOSIS — K219 Gastro-esophageal reflux disease without esophagitis: Secondary | ICD-10-CM | POA: Insufficient documentation

## 2014-10-17 DIAGNOSIS — J302 Other seasonal allergic rhinitis: Secondary | ICD-10-CM | POA: Insufficient documentation

## 2014-10-17 HISTORY — DX: Gastro-esophageal reflux disease without esophagitis: K21.9

## 2014-10-17 MED ORDER — AMLODIPINE BESYLATE 10 MG PO TABS: 10.0000 mg | ORAL_TABLET | Freq: Every day | ORAL | Status: DC

## 2014-10-17 NOTE — Patient Instructions (Signed)
Amlodipine 10mg . Start with a 1/2 pill in the morning or evening just be consistent. Then in 1 week increase to full pill if tolerating medicine.   See me in about 2 weeks for a morning appointment. If you come fasting we can update your labs.   Health Maintenance Due  Topic Date Due  . COLONOSCOPY -we can refer after holidays per your preference, please remind me at that time 10/11/2006

## 2014-10-17 NOTE — Progress Notes (Signed)
Tyrone ConchStephen Livy Ross, MD Phone: [phone]  Subjective:  Patient presents today to establish care with me as their new primary care provider. Patient was formerly a patient of Dr. Debby BudNorins. Chief complaint-noted.   Hypertension-poor control  BP Readings from Last 3 Encounters:  10/17/14 160/90  06/22/13 158/92  01/01/12 160/96  Home BP monitoring-home readings in high 140s low 160s.  Walks 3-4x a week about 3 miles.  Compliant with medications-yes without side effects ROS-Denies any CP, HA, SOB, blurry vision, LE edema .  The following were reviewed and entered/updated in epic: Past Medical History  Diagnosis Date  . ELEVATED BLOOD PRESSURE WITHOUT DIAGNOSIS OF HYPERTENSION 10/17/2010  . Other abnormal blood chemistry 10/17/2010   Patient Active Problem List   Diagnosis Date Noted  . Hyperglycemia 10/17/2010    Priority: Medium  . Essential hypertension 10/17/2010    Priority: Medium  . Seasonal allergies 10/17/2014    Priority: Low  . GERD (gastroesophageal reflux disease) 10/17/2014    Priority: Low  . Routine health maintenance 06/22/2013    Priority: Low   Past Surgical History  Procedure Laterality Date  . Trauma- stress fractures while in high school and running track      Family History  Problem Relation Age of Onset  . Stroke Mother     late 770s  . Thyroid disease Mother     since he was a child  . Cancer Father     melanoma  . Coronary artery disease Father     mid 3370s  . Hypertension Father   . Hyperlipidemia Father   . Diabetes Brother     Medications- reviewed and updated Current Outpatient Prescriptions  Medication Sig Dispense Refill  . lisinopril-hydrochlorothiazide (PRINZIDE,ZESTORETIC) 20-25 MG per tablet Take 1 tablet by mouth daily. 30 tablet 3  . loratadine (CLARITIN) 10 MG tablet Take 10 mg by mouth daily.      . potassium chloride SA (K-DUR,KLOR-CON) 20 MEQ tablet Take 20 mEq by mouth daily.      . famotidine (PEPCID AC) 10 MG chewable  tablet Chew 10 mg by mouth 2 (two) times daily.       Allergies-reviewed and updated No Known Allergies  Social History Main Topics  . Smoking status: Never Smoker   . Smokeless tobacco: None  . Alcohol Use: 0.0 oz/week    0 Not specified per week     Comment: 2 per day  . Drug Use: No  . Sexual Activity: None   Other Topics Concern  . None   Social History Narrative   [employer]- [employer]- Journalism   Married '80 (leslie patient at [place])   [facility]    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  Ext: 1+ edema Skin: warm, dry, no rash Neuro: grossly normal, moves all extremities, PERRLA  Assessment/Plan:  Essential hypertension Blood pressure poorly controlled on lisinopril-hydrochlorothiazide 20-25 milligrams. Discussed making 20-12.5 and taking twice a dayversus addition of amlodipine. We will start with addition of amlodipine and have patient follow-up in 2 weeks. We will check fasting blood work at that time as well has been a while. Also needs A1c.  may need to split  lisinopril hydrochlorothiazide at next visit to increase to Max dose. Meds ordered this encounter  Medications  . amLODipine (NORVASC) 10 MG tablet    Sig: Take 1 tablet (10 mg total) by mouth daily.    Dispense:  30 tablet    Refill:  3

## 2014-10-17 NOTE — Assessment & Plan Note (Signed)
Blood pressure poorly controlled on lisinopril-hydrochlorothiazide 20-25 milligrams. Discussed making 20-12.5 and taking twice a dayversus addition of amlodipine. We will start with addition of amlodipine and have patient follow-up in 2 weeks. We will check fasting blood work at that time as well has been a while. Also needs A1c.

## 2015-03-21 ENCOUNTER — Other Ambulatory Visit: Payer: Self-pay | Admitting: Family Medicine

## 2015-04-05 ENCOUNTER — Ambulatory Visit (INDEPENDENT_AMBULATORY_CARE_PROVIDER_SITE_OTHER): Payer: BC Managed Care – PPO | Admitting: Family Medicine

## 2015-04-05 ENCOUNTER — Encounter: Payer: Self-pay | Admitting: Family Medicine

## 2015-04-05 VITALS — BP 120/84 | HR 83 | Temp 98.3°F | Wt 154.0 lb

## 2015-04-05 DIAGNOSIS — R739 Hyperglycemia, unspecified: Secondary | ICD-10-CM

## 2015-04-05 DIAGNOSIS — I10 Essential (primary) hypertension: Secondary | ICD-10-CM

## 2015-04-05 DIAGNOSIS — Z0000 Encounter for general adult medical examination without abnormal findings: Secondary | ICD-10-CM

## 2015-04-05 DIAGNOSIS — Z7251 High risk heterosexual behavior: Secondary | ICD-10-CM

## 2015-04-05 DIAGNOSIS — Z1211 Encounter for screening for malignant neoplasm of colon: Secondary | ICD-10-CM

## 2015-04-05 NOTE — Progress Notes (Signed)
Tana Conch, MD Phone: [phone]  Subjective:   Tyrone Davis is a 59 y.o. year old very pleasant male patient who presents with the following:  Hypertension- much improved control with additon of amlodipine at last visit to ace i-hctz combo  BP Readings from Last 3 Encounters:  04/05/15 120/84  10/17/14 160/90  06/22/13 158/92   Home BP monitoring-no but able Compliant with medications-yes without side effects ROS-Denies any CP, HA, SOB, blurry vision, LE edema, transient weakness, orthopnea, PND.   Hyperglycemia -prior a1c of 6.1. Have discussed diet/exercise/healthy living to help prevent progression to DM but needs updated a1c today ROS- no polyphagia, polydipsia  Past Medical History- Patient Active Problem List   Diagnosis Date Noted  . Hyperglycemia 10/17/2010    Priority: Medium  . Essential hypertension 10/17/2010    Priority: Medium  . Seasonal allergies 10/17/2014    Priority: Low  . GERD (gastroesophageal reflux disease) 10/17/2014    Priority: Low  . Routine health maintenance 06/22/2013    Priority: Low   Medications- reviewed and updated Current Outpatient Prescriptions  Medication Sig Dispense Refill  . amLODipine (NORVASC) 10 MG tablet Take 1 tablet (10 mg total) by mouth daily. 30 tablet 3  . famotidine (PEPCID AC) 10 MG chewable tablet Chew 10 mg by mouth 2 (two) times daily.      [name] lisinopril-hydrochlorothiazide (PRINZIDE,ZESTORETIC) 20-25 MG per tablet TAKE 1 TABLET ONCE DAILY. 30 tablet 0  . loratadine (CLARITIN) 10 MG tablet Take 10 mg by mouth daily.      . potassium chloride SA (K-DUR,KLOR-CON) 20 MEQ tablet Take 20 mEq by mouth daily.       Objective: BP 120/84 mmHg  Pulse 83  Temp(Src) 98.3 F (36.8 C)  Wt 154 lb (69.854 kg) Gen: NAD, resting comfortably, wears glasses CV: RRR no murmurs rubs or gallops Lungs: CTAB no crackles, wheeze, rhonchi Abdomen: soft/nontender/nondistended/normal bowel sounds.  Ext: no edema Skin:  warm, dry, no rash Neuro: grossly normal, moves all extremities, normal gait   Assessment/Plan:  Hyperglycemia Check a1c     Essential hypertension Continue Lisinopril 20-hctz 25, amlodipine  as controlled   Routine health maintenance Colonoscopy ordered. 04/05/15 discussed PSA testing and patient declines until 2016 at least (q 2 year testing) but may ultimately decline with grade D usptf evidence.    6-12 month follow up as long as checking BP at home and controlled 12 months ok.   Orders Placed This Encounter  Procedures  . CBC    [facility]    Standing Status: Future     Number of Occurrences:      Standing Expiration Date: 04/04/2016  . Comprehensive metabolic panel    [place]    Standing Status: Future     Number of Occurrences:      Standing Expiration Date: 04/04/2016    Order Specific Question:  Has the patient fasted?    Answer:  No  . Lipid panel    [facility]    Standing Status: Future     Number of Occurrences:      Standing Expiration Date: 04/04/2016    Order Specific Question:  Has the patient fasted?    Answer:  No  . TSH    [facility]    Standing Status: Future     Number of Occurrences:      Standing Expiration Date: 04/04/2016  . HIV antibody    [facility]    Standing Status: Future     Number of Occurrences:  Standing Expiration Date: 04/04/2016  . Hemoglobin A1c    [place]    Standing Status: Future     Number of Occurrences:      Standing Expiration Date: 04/04/2016  . Ambulatory referral to Gastroenterology    Referral Priority:  Routine    Referral Type:  Consultation    Referral Reason:  Specialty Services Required    Requested Specialty:  Gastroenterology    Number of Visits Requested:  1

## 2015-04-05 NOTE — Patient Instructions (Addendum)
BP looks great  Go to [facility] to update fasting at your convenience. Ardine BjorkKeba told me you do not need an appointment but you could call for hours and to confirm this if desired.   We will call you within a week about your referral to GI for colonoscopy. If you do not hear within 2 weeks, give us a call.    See you in 6-12 months

## 2015-04-06 NOTE — Assessment & Plan Note (Signed)
Continue Lisinopril 20-hctz 25, amlodipine 10mg  as controlled

## 2015-04-06 NOTE — Assessment & Plan Note (Signed)
Colonoscopy ordered. 04/05/15 discussed PSA testing and patient declines until 2016 at least (q 2 year testing) but may ultimately decline with grade D usptf evidence.

## 2015-04-06 NOTE — Assessment & Plan Note (Signed)
Check a1c 

## 2015-05-21 ENCOUNTER — Other Ambulatory Visit: Payer: Self-pay | Admitting: Family Medicine

## 2015-11-20 ENCOUNTER — Other Ambulatory Visit: Payer: Self-pay | Admitting: Family Medicine

## 2016-07-30 ENCOUNTER — Other Ambulatory Visit: Payer: Self-pay | Admitting: Family Medicine

## 2016-10-29 ENCOUNTER — Other Ambulatory Visit: Payer: Self-pay | Admitting: Family Medicine

## 2016-12-26 ENCOUNTER — Ambulatory Visit (INDEPENDENT_AMBULATORY_CARE_PROVIDER_SITE_OTHER): Payer: BC Managed Care – PPO | Admitting: Family Medicine

## 2016-12-26 ENCOUNTER — Encounter: Payer: Self-pay | Admitting: Family Medicine

## 2016-12-26 VITALS — BP 136/68 | HR 79 | Temp 98.2°F | Ht 69.0 in | Wt 158.8 lb

## 2016-12-26 DIAGNOSIS — Z1211 Encounter for screening for malignant neoplasm of colon: Secondary | ICD-10-CM

## 2016-12-26 DIAGNOSIS — Z7251 High risk heterosexual behavior: Secondary | ICD-10-CM

## 2016-12-26 DIAGNOSIS — Z1159 Encounter for screening for other viral diseases: Secondary | ICD-10-CM

## 2016-12-26 DIAGNOSIS — Z0000 Encounter for general adult medical examination without abnormal findings: Secondary | ICD-10-CM | POA: Diagnosis not present

## 2016-12-26 DIAGNOSIS — R739 Hyperglycemia, unspecified: Secondary | ICD-10-CM

## 2016-12-26 NOTE — Addendum Note (Signed)
Addended by: Shelva MajesticHUNTER, STEPHEN O on: 12/26/2016 09:17 AM   Modules accepted: Orders

## 2016-12-26 NOTE — Progress Notes (Signed)
Pre visit review using our clinic review tool, if applicable. No additional management support is needed unless otherwise documented below in the visit note. 

## 2016-12-26 NOTE — Patient Instructions (Signed)
We will call you within a week or two about your referral to GI for colonoscopy. If you do not hear within 3 weeks, give us a call.   [place] Let's  Try to up the veggies, decrease the juice due to increased risk of diabetes.

## 2016-12-26 NOTE — Progress Notes (Addendum)
Phone: [phone]  Subjective:  Davis presents today for their annual physical. Chief complaint-noted.   See problem oriented charting- ROS- full  review of systems was completed and negative except for: occasional sneezing, runny nose- but has done well recently  The following were reviewed and entered/updated in epic: Past Medical History:  Diagnosis Date  . Essential hypertension 10/17/2010   Lisinopril 20-hctz 25, amlodipine 10mg    . GERD (gastroesophageal reflux disease) 10/17/2014   Pepcid/famotidine    Davis Active Problem List   Diagnosis Date Noted  . Hyperglycemia 10/17/2010    Priority: Medium  . Essential hypertension 10/17/2010    Priority: Medium  . Seasonal allergies 10/17/2014    Priority: Low  . GERD (gastroesophageal reflux disease) 10/17/2014    Priority: Low  . Routine health maintenance 06/22/2013    Priority: Low   Past Surgical History:  Procedure Laterality Date  . Trauma- stress fractures while in high school and running track      Family History  Problem Relation Age of Onset  . Stroke Mother     late 3470s  . Thyroid disease Mother     since he was a child  . Cancer Father     melanoma  . Coronary artery disease Father     mid 7870s  . Hypertension Father   . Hyperlipidemia Father   . Diabetes Brother     Medications- reviewed and updated Current Outpatient Prescriptions  Medication Sig Dispense Refill  . amLODipine (NORVASC) 10 MG tablet TAKE 1 TABLET DAILY. 30 tablet 6  . famotidine (PEPCID AC) 10 MG chewable tablet Chew 10 mg by mouth 2 (two) times daily.      [name]. lisinopril-hydrochlorothiazide (PRINZIDE,ZESTORETIC) 20-25 MG tablet TAKE 1 TABLET ONCE DAILY. 90 tablet 1  . loratadine (CLARITIN) 10 MG tablet Take 10 mg by mouth daily.      . potassium chloride SA (K-DUR,KLOR-CON) 20 MEQ tablet Take 20 mEq by mouth daily.       No current facility-administered medications for this visit.     Allergies-reviewed and updated No  Known Allergies  Social History   Social History  . Marital status: Single    Spouse name: N/A  . Number of children: N/A  . Years of education: N/A   Social History Main Topics  . Smoking status: Never Smoker  . Smokeless tobacco: None  . Alcohol use 0.0 oz/week     Comment: 2 per day  . Drug use: No  . Sexual activity: Not Asked   Other Topics Concern  . None   Social History Narrative   [employer]   Married '80 (Tyrone Davis at [place])   [facility]    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 Oropharynx normal Neck: no thyromegaly CV: RRR no murmurs rubs or gallops Lungs: CTAB no crackles, wheeze, rhonchi Abdomen: soft/nontender/nondistended/normal bowel sounds. No rebound or guarding.  Ext: no edema Skin: warm, dry Neuro: grossly normal, moves all extremities, PERRLA Rectal: normal tone, normal sized prostate, no masses or tenderness  Assessment/Plan:  61 y.o. male presenting for annual physical.  Health Maintenance counseling: 1. Anticipatory guidance: Davis counseled regarding regular dental exams, eye exams, wearing seatbelts.  2. Risk factor reduction:  Advised Davis of need for regular exercise and diet rich and fruits and vegetables to reduce risk of heart attack and stroke. Walking 5 miles a day. Consider cutting back on juice, increasing water.  3. Immunizations/screenings/ancillary studies Immunization History  Administered Date(s) Administered  . Influenza Whole 09/14/2010  . Influenza-Unspecified 10/03/2014, 10/01/2016  . Tdap 06/22/2013   Health Maintenance Due    Topic Date Due  . Hepatitis C Screening - offered and will do today 27-Jan-1956  . HIV Screening - offered and will do today 10/12/1971  . ZOSTAVAX - likely wait for shingrix 10/11/2016   4. Prostate cancer screening-  low risk rectal exam, get psa . Nocturia 1-2x a night stable for years. Large normal prostate Lab Results  Component Value Date   PSA 1.30 01/27/2011   5. Colon cancer screening -  refer again today  Status of chronic or acute concerns  HTN: at goal per [facility] on amlodipine, lisinopril hctz 20-25mg   Hyperglycemia- - has not lost weight, update a1c Lab Results  Component Value Date   HGBA1C 6.1 06/22/2013   Wt Readings from Last 3 Encounters:  12/26/16 158 lb 12.8 oz (72 kg)  04/05/15 154 lb (69.9 kg)  10/17/14 157 lb (71.2 kg)   GERD- on pepcid  Allergies- on claritin  1 year physical  Future fasting labs Orders Placed This Encounter  Procedures  . Hemoglobin A1c    [place]    Standing Status:   Future    Standing Expiration Date:   12/26/2017  . CBC    Standing Status:   Future    Standing Expiration Date:   12/26/2017  . Comprehensive metabolic panel    [place]    Standing Status:   Future    Standing Expiration Date:   12/26/2017  . Hepatitis C antibody, reflex    [facility]    Standing Status:   Future    Standing Expiration Date:   12/26/2017  . HIV antibody    Standing Status:   Future    Standing Expiration Date:   12/26/2017  . Lipid panel    Standing Status:   Future    Standing Expiration Date:   12/26/2017  . PSA    Standing Status:   Future    Standing Expiration Date:   12/26/2017  . Ambulatory referral to Gastroenterology    Referral Priority:   Routine    Referral Type:   Consultation    Referral Reason:   Specialty Services Required    Number of Visits Requested:   1   Return precautions advised.   Tana Conch, MD

## 2017-02-26 ENCOUNTER — Encounter: Payer: Self-pay | Admitting: Family Medicine

## 2017-02-27 ENCOUNTER — Other Ambulatory Visit: Payer: Self-pay | Admitting: Family Medicine

## 2017-12-16 ENCOUNTER — Other Ambulatory Visit: Payer: Self-pay | Admitting: Family Medicine

## 2018-01-11 ENCOUNTER — Telehealth: Payer: Self-pay

## 2018-01-11 NOTE — Telephone Encounter (Signed)
-----   Message from Shelva MajesticStephen O Hunter, MD sent at 12/31/2017  8:00 AM EST ----- Patient never came back for labs last year- lets get him in for a visit sometime over next 3-6 months for physical. Have him come fasting so we can update labs at time of visit.   Tana ConchStephen Hunter  ----- Message ----- From: SYSTEM Sent: 12/31/2017  12:07 AM To: Shelva MajesticStephen O Hunter, MD

## 2018-01-11 NOTE — Telephone Encounter (Signed)
Called patient and left a voicemail message asking patient to call back and schedule an appointment 

## 2018-01-11 NOTE — Progress Notes (Signed)
Called patient and left a voicemail message asking patient to return my call and schedule a physical and get his labs drawn

## 2019-03-04 ENCOUNTER — Other Ambulatory Visit: Payer: Self-pay | Admitting: Family Medicine

## 2019-03-10 ENCOUNTER — Encounter: Payer: Self-pay | Admitting: Family Medicine

## 2019-03-10 ENCOUNTER — Ambulatory Visit (INDEPENDENT_AMBULATORY_CARE_PROVIDER_SITE_OTHER): Payer: BC Managed Care – PPO | Admitting: Family Medicine

## 2019-03-10 DIAGNOSIS — Z1211 Encounter for screening for malignant neoplasm of colon: Secondary | ICD-10-CM

## 2019-03-10 DIAGNOSIS — I10 Essential (primary) hypertension: Secondary | ICD-10-CM | POA: Diagnosis not present

## 2019-03-10 DIAGNOSIS — K219 Gastro-esophageal reflux disease without esophagitis: Secondary | ICD-10-CM

## 2019-03-10 DIAGNOSIS — J302 Other seasonal allergic rhinitis: Secondary | ICD-10-CM | POA: Diagnosis not present

## 2019-03-10 MED ORDER — LISINOPRIL-HYDROCHLOROTHIAZIDE 20-12.5 MG PO TABS: 2.0000 | ORAL_TABLET | Freq: Every day | ORAL | 3 refills | Status: DC

## 2019-03-10 MED ORDER — POTASSIUM CHLORIDE CRYS ER 20 MEQ PO TBCR: 20.0000 meq | EXTENDED_RELEASE_TABLET | Freq: Every day | ORAL | 3 refills | Status: AC

## 2019-03-10 MED ORDER — AMLODIPINE BESYLATE 10 MG PO TABS
10.0000 mg | ORAL_TABLET | Freq: Every day | ORAL | 3 refills | Status: AC
Start: 2019-03-10 — End: ?

## 2019-03-10 NOTE — Patient Instructions (Addendum)
Health Maintenance Due  Topic Date Due  . Hepatitis C Screening  [birth year]  . HIV Screening  10/12/1971  . COLONOSCOPY - team will order cologuars 10/11/2006  . INFLUENZA VACCINE Done at work  07/15/2018   Please call to schedule 1 month web visit- though likely need to order labs at that time - has been several years since we have had labs

## 2019-03-10 NOTE — Progress Notes (Signed)
Phone [phone]   Subjective:  Virtual visit via Video note  Our team/I connected with Tyrone Davis on 03/10/19 at  1:40 PM EDT by phone (patient did not have equipment for webex- his video portion did not work but he could see me) and verified that I am speaking with the correct person using two identifiers.  Location patient: Work/home- [employer] Location provider: [facility], office Persons participating in the virtual visit:  patient  Our team/I discussed the limitations of evaluation and management by telemedicine and the availability of in person appointments. In light of current covid-19 pandemic, patient also understands that we are trying to protect them by minimizing in office contact if at all possible.  The patient expressed consent for telemedicine visit and agreed to proceed.   ROS- No chest pain or shortness of breath. No headache or blurry vision. No fever or cough.    Past Medical History-  Patient Active Problem List   Diagnosis Date Noted  . Hyperglycemia 10/17/2010    Priority: Medium  . Essential hypertension 10/17/2010    Priority: Medium  . Seasonal allergies 10/17/2014    Priority: Low  . GERD (gastroesophageal reflux disease) 10/17/2014    Priority: Low  . Routine health maintenance 06/22/2013    Priority: Low    Medications- reviewed and updated Current Outpatient Medications  Medication Sig Dispense Refill  . amLODipine (NORVASC) 10 MG tablet Take 1 tablet (10 mg total) by mouth daily. 90 tablet 3  . famotidine (PEPCID AC) 10 MG chewable tablet Chew 10 mg by mouth 2 (two) times daily.      [name] loratadine (CLARITIN) 10 MG tablet Take 10 mg by mouth daily.      [name] lisinopril-hydrochlorothiazide (PRINZIDE,ZESTORETIC) 20-12.5 MG tablet Take 2 tablets by mouth daily. 180 tablet 3  . potassium chloride SA (K-DUR,KLOR-CON) 20 MEQ tablet Take 1 tablet (20 mEq total) by mouth daily. 90 tablet 3   No current facility-administered medications for this visit.      Objective:  Patient did not have access today to any self-monitoring options for vital signs as he was at his office Normal speech Nonlabored breathing No acute distress     Assessment and Plan   #hypertension S: poorly controlled on lisinopril hydrochlorothiazide 20-25 mg and amlodipine 5 mg.Dizzy on full dose amlodipine 10 mg-thus he has been cutting medication in half  Home checks 135-139/high 80s-95 BP Readings from Last 3 Encounters:  12/26/16 136/68  04/05/15 120/84  10/17/14 (!) 160/90  A/P: Poor control-dizzy on higher dose amlodipine 10 mg-he will continue half tablet.  We will try to increase lisinopril to max dose 40 and maintain hydrochlorothiazide at 25 mg.  Recommended he call in for follow-up visit in 1 month by WebEx-we really need to get some updated labs on him has been several years-he agrees to consider at next visit  #GERD S: Well controlled on Pepcid 10 mg twice daily A/P: Stable. Continue current medications.    #Allergic rhinitis S: Controlled on Claritin 10 mg in the past when he uses seasonally-he thinks he is about to have to restart this. A/P:  Stable. Continue current medications with Claritin seasonally  Other notes: 1.  Agrees to Cologuard-team will send him this  Lab/Order associations: Screening for colon cancer - Plan: Cologuard  Meds ordered this encounter  Medications  . amLODipine (NORVASC) 10 MG tablet    Sig: Take 1 tablet (10 mg total) by mouth daily.    Dispense:  90 tablet  Refill:  3  . potassium chloride SA (K-DUR,KLOR-CON) 20 MEQ tablet    Sig: Take 1 tablet (20 mEq total) by mouth daily.    Dispense:  90 tablet    Refill:  3  . lisinopril-hydrochlorothiazide (PRINZIDE,ZESTORETIC) 20-12.5 MG tablet    Sig: Take 2 tablets by mouth daily.    Dispense:  180 tablet    Refill:  3    Return precautions advised.  Tana Conch, MD

## 2019-05-16 ENCOUNTER — Telehealth: Payer: Self-pay

## 2019-05-16 NOTE — Telephone Encounter (Signed)
I left patient a detailed message on VM, encouraging him to get  the cologuard done.

## 2019-05-16 NOTE — Telephone Encounter (Signed)
-----   Message from Shelva Majestic, MD sent at 05/16/2019  7:37 AM EDT ----- He needs to complete his cologuard. Please call to encourage him.   Tana Conch ----- Message ----- From: SYSTEM Sent: 03/15/2019  12:04 AM EDT To: Shelva Majestic, MD

## 2019-05-24 ENCOUNTER — Telehealth: Payer: Self-pay

## 2019-05-25 LAB — COLOGUARD: Cologuard: NEGATIVE

## 2019-05-25 NOTE — Telephone Encounter (Signed)
Pt returned call to [place].  Please call pt.

## 2019-05-25 NOTE — Telephone Encounter (Signed)
See note  Copied from [place] [id number]. Topic: General - Other >> May 24, 2019  4:37 PM Marin Olp L wrote: Reason for CRM: Patient missed a call from [place]?

## 2019-05-26 NOTE — Telephone Encounter (Signed)
Just FYI or do you need anything from me? Happy to help if needed.

## 2019-05-26 NOTE — Telephone Encounter (Signed)
Called patient in regards to  cologuard, patient stated he sent it off on  05/18/2019/.

## 2019-06-01 NOTE — Progress Notes (Signed)
Tyrone Craze, RN to Marin Olp, MD     3:10 PM Note   Called patient in regards to cologuard, patient stated he sent it off on  05/18/2019/.

## 2019-06-09 ENCOUNTER — Encounter: Payer: Self-pay | Admitting: Family Medicine

## 2019-06-14 NOTE — Telephone Encounter (Signed)
Attempted to call pt at callback number but unable to LM due to VM being full.  Cologuard result is negative.

## 2019-06-14 NOTE — Telephone Encounter (Signed)
Pt stated he received a letter stating Dr. Yong Channel had been sent his Cologuard results. Requesting callback. Please advise.

## 2019-06-15 ENCOUNTER — Encounter: Payer: Self-pay | Admitting: Family Medicine

## 2019-06-20 NOTE — Telephone Encounter (Signed)
Pt stated someone reached out to him and informed him that results were negative.

## 2019-10-16 DEATH — deceased

## 2020-01-27 ENCOUNTER — Ambulatory Visit: Payer: BC Managed Care – PPO

## 2020-03-07 ENCOUNTER — Encounter: Payer: Self-pay | Admitting: Family Medicine

## 2020-04-30 ENCOUNTER — Other Ambulatory Visit: Payer: Self-pay | Admitting: Family Medicine

## 2022-05-30 ENCOUNTER — Encounter: Payer: BC Managed Care – PPO | Admitting: Family Medicine

## 2022-09-08 ENCOUNTER — Encounter: Payer: Self-pay | Admitting: *Deleted

## 2022-11-27 ENCOUNTER — Encounter: Payer: Self-pay | Admitting: *Deleted

## 2024-12-06 ENCOUNTER — Encounter: Payer: Self-pay | Admitting: Gastroenterology

## 2025-01-23 ENCOUNTER — Ambulatory Visit: Admitting: Gastroenterology
